# Patient Record
Sex: Male | Born: 1995 | Race: Black or African American | Hispanic: No | Marital: Single | Smoking: Never smoker
Health system: Southern US, Community
[De-identification: ages and names within clinical notes are randomized; demographics above are authoritative.]

---

## 2020-06-10 ENCOUNTER — Emergency Department (HOSPITAL_BASED_OUTPATIENT_CLINIC_OR_DEPARTMENT_OTHER): Payer: BC Managed Care – PPO

## 2020-06-10 ENCOUNTER — Other Ambulatory Visit: Payer: Self-pay

## 2020-06-10 ENCOUNTER — Encounter (HOSPITAL_BASED_OUTPATIENT_CLINIC_OR_DEPARTMENT_OTHER): Payer: Self-pay | Admitting: Emergency Medicine

## 2020-06-10 ENCOUNTER — Emergency Department (HOSPITAL_BASED_OUTPATIENT_CLINIC_OR_DEPARTMENT_OTHER)
Admission: EM | Admit: 2020-06-10 | Discharge: 2020-06-10 | Disposition: A | Discharge status: Home / Self Care | Payer: BC Managed Care – PPO | Attending: Emergency Medicine | Admitting: Emergency Medicine

## 2020-06-10 DIAGNOSIS — X501XXA Overexertion from prolonged static or awkward postures, initial encounter: Secondary | ICD-10-CM | POA: Insufficient documentation

## 2020-06-10 DIAGNOSIS — M25562 Pain in left knee: Secondary | ICD-10-CM | POA: Insufficient documentation

## 2020-06-10 DIAGNOSIS — Y990 Civilian activity done for income or pay: Secondary | ICD-10-CM | POA: Insufficient documentation

## 2020-06-10 MED ORDER — IBUPROFEN 400 MG PO TABS
600.0000 mg | ORAL_TABLET | Freq: Once | ORAL | Status: AC
  Administered 2020-06-10: 600 mg via ORAL
  Filled 2020-06-10: qty 1

## 2020-06-10 NOTE — ED Triage Notes (Signed)
Reports he twisted his left knee at work.  Painful but able to walk in triage.

## 2020-06-10 NOTE — Discharge Instructions (Signed)
Please follow up with both [facility] and Orthopedist Dr. Shon Baton for further evaluation of your knee pain.   You will need to call to schedule an appointment with both of them for further eval. Employee Health can do you worker's comp paperwork.   Please use knee immobilizer and crutches until you can see orthopedics for your knee pain. While at home please rest, ice, and elevate your knee to help reduce pain/swelling. You can take Ibuprofen and Tylenol as needed for your pain.   Return to the ED for any new/worsening symptoms

## 2020-06-10 NOTE — ED Provider Notes (Signed)
[facility] EMERGENCY DEPARTMENT Provider Note   CSN: [id number] Arrival date & time: 06/10/20  1457     History Chief Complaint  Patient presents with  . Knee Pain    Dennis Martinez is a 25 y.o. male who presents to the ED today from work with complaint of sudden onset, constant, throbbing, L knee pain s/p injury that occurred earlier today. Pt was stepping off a pallet when he twisted his left knee in the process and felt immediate pain/a popping sensation which caused him to buckle at the knee. He states he was able to catch himself before he fell completley on the ground. No head injury or LOC. He has been unable to bare weight onto the knee since then. Has not taken anything for pain. He was advised to come to this ED for further eval/worker's comp. Pt denies any other complaints at this time. No previous knee injury.   The history is provided by the patient and medical records.       No past medical history on file.  There are no problems to display for this patient.   History reviewed. No pertinent surgical history.     No family history on file.  Social History   Tobacco Use  . Smoking status: Never Smoker  . Smokeless tobacco: Never Used  Substance Use Topics  . Alcohol use: Never  . Drug use: Never    Home Medications Prior to Admission medications   Not on File    Allergies    Patient has no allergy information on record.  Review of Systems   Review of Systems  Constitutional: Negative for chills and fever.  Musculoskeletal: Positive for arthralgias and joint swelling.  Skin: Negative for color change and wound.  Neurological: Negative for weakness and numbness.  All other systems reviewed and are negative.   Physical Exam Updated Vital Signs BP 114/83 (BP Location: Right Arm)   Pulse 71   Temp 98.3 F (36.8 C) (Oral)   Resp 18   Ht 6\' 2"  (1.88 m)   Wt 90.3 kg   SpO2 100%   BMI 25.55 kg/m   Physical Exam Vitals and nursing  note reviewed.  Constitutional:      Appearance: He is not ill-appearing.  HENT:     Head: Normocephalic and atraumatic.  Eyes:     Conjunctiva/sclera: Conjunctivae normal.  Cardiovascular:     Rate and Rhythm: Normal rate and regular rhythm.  Pulmonary:     Effort: Pulmonary effort is normal.     Breath sounds: Normal breath sounds.  Musculoskeletal:     Comments: Moderate swelling to left knee compared to right with TTP along the inferior aspect. ROM limited s/2 pain however able to flex and extend. Negative anterior and posterior drawer test. No obvious varus or valgus laxity however pt unable to fully tolerate test due to pain. Strength and sensation intact. 2+ DP pulse.   Skin:    General: Skin is warm and dry.     Coloration: Skin is not jaundiced.  Neurological:     Mental Status: He is alert.     ED Results / Procedures / Treatments   Labs (all labs ordered are listed, but only abnormal results are displayed) Labs Reviewed - No data to display  EKG None  Radiology DG Knee Complete 4 Views Left  Result Date: 06/10/2020 CLINICAL DATA:  Pain EXAM: LEFT KNEE - COMPLETE 4+ VIEW COMPARISON:  None. FINDINGS: No acute fracture or dislocation.  Joint spaces and alignment are maintained. No area of erosion or osseous destruction. No unexpected radiopaque foreign body. Soft tissues are unremarkable. IMPRESSION: No acute fracture or dislocation. Electronically Signed   By: Meda Klinefelter MD   On: 06/10/2020 15:51    Procedures Procedures   Medications Ordered in ED Medications  ibuprofen (ADVIL) tablet 600 mg (600 mg Oral Given 06/10/20 1530)    ED Course  I have reviewed the triage vital signs and the nursing notes.  Pertinent labs & imaging results that were available during my care of the patient were reviewed by me and considered in my medical decision making (see chart for details).    MDM Rules/Calculators/A&P                          25 year old male presents  to the ED today from work after sustaining a left knee injury where he twisted his ankle and felt a popping sensation.  Unable to bear weight since then.  Sent for [employer].  On arrival to the ED vitals are stable.  Patient is afebrile, nontachycardic and nontachypneic and appears to be in no acute distress.  He has obvious mild swelling noted to the left knee compared to right with tenderness palpation along the inferior aspect.  Range of motion limited however able to flex and extend.  Strength and sensation intact.  No obvious ligamentous injury at this time.  We will plan for x-ray today.  Patient will likely need knee immobilizer and crutches and Ortho follow-up.  Unfortunately we do not do [employer]. here today however will provide information for [facility] for further evaluation.  Patient was sent with paperwork to fill out for physical capacities, will send him back home with this and have the provider at health and wellness fill this out. Ibuprofen for pain.   Xray negative for bony abnormality at this time. Will provide knee immobilizer and crutches. Pt given info for [employer] for worker's comp. Will have him follow up with orthopedist for same. Pt instructed on RICE therapy at home and Ibuprofen/Tylenol PRN for pain. He is in agreement with plan and stable for discharge.   This note was prepared using Dragon voice recognition software and may include unintentional dictation errors due to the inherent limitations of voice recognition software.  Final Clinical Impression(s) / ED Diagnoses Final diagnoses:  Acute pain of left knee    Rx / DC Orders ED Discharge Orders    None       Discharge Instructions     Please follow up with both [facility] and Orthopedist Dr. Shon Baton for further evaluation of your knee pain.   You will need to call to schedule an appointment with both of them for further eval. Employee Health can do you  worker's comp paperwork.   Please use knee immobilizer and crutches until you can see orthopedics for your knee pain. While at home please rest, ice, and elevate your knee to help reduce pain/swelling. You can take Ibuprofen and Tylenol as needed for your pain.   Return to the ED for any new/worsening symptoms       Tanda Rockers, Cordelia Poche 06/10/20 1612    Cathren Laine, MD 06/14/20 1402

## 2020-06-10 NOTE — ED Notes (Signed)
ED Provider at bedside. 

## 2021-12-10 IMAGING — CR DG KNEE COMPLETE 4+V*L*
4 series · 4 of 4 positions shown · non-contrast
Comparison: None.

CLINICAL DATA: Pain

EXAM:
LEFT KNEE - COMPLETE 4+ VIEW

[t knee lat left]
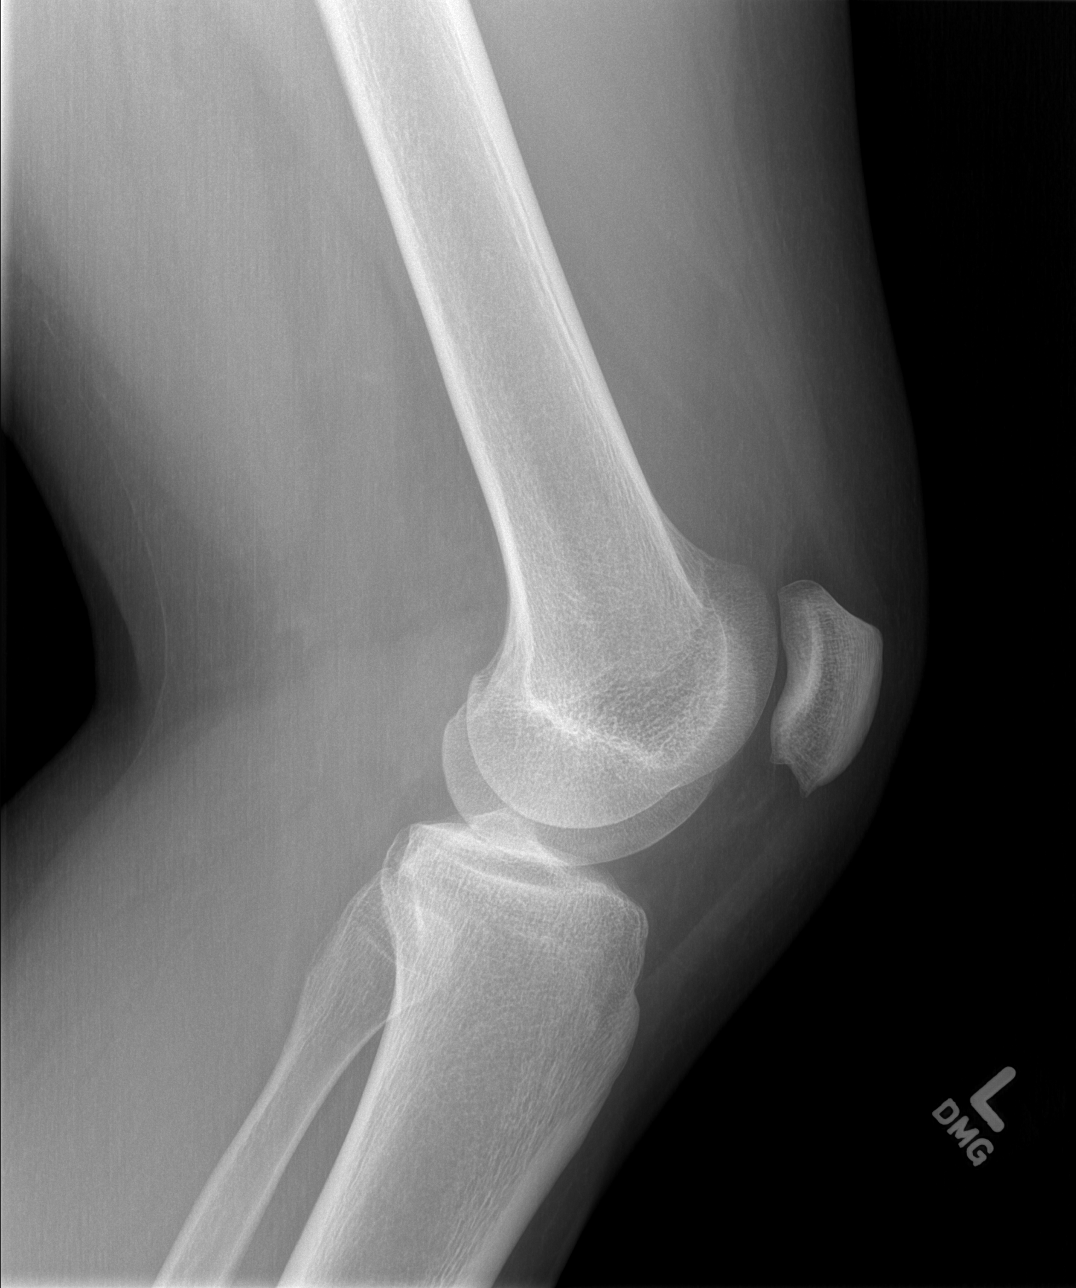

[t knee ap left]
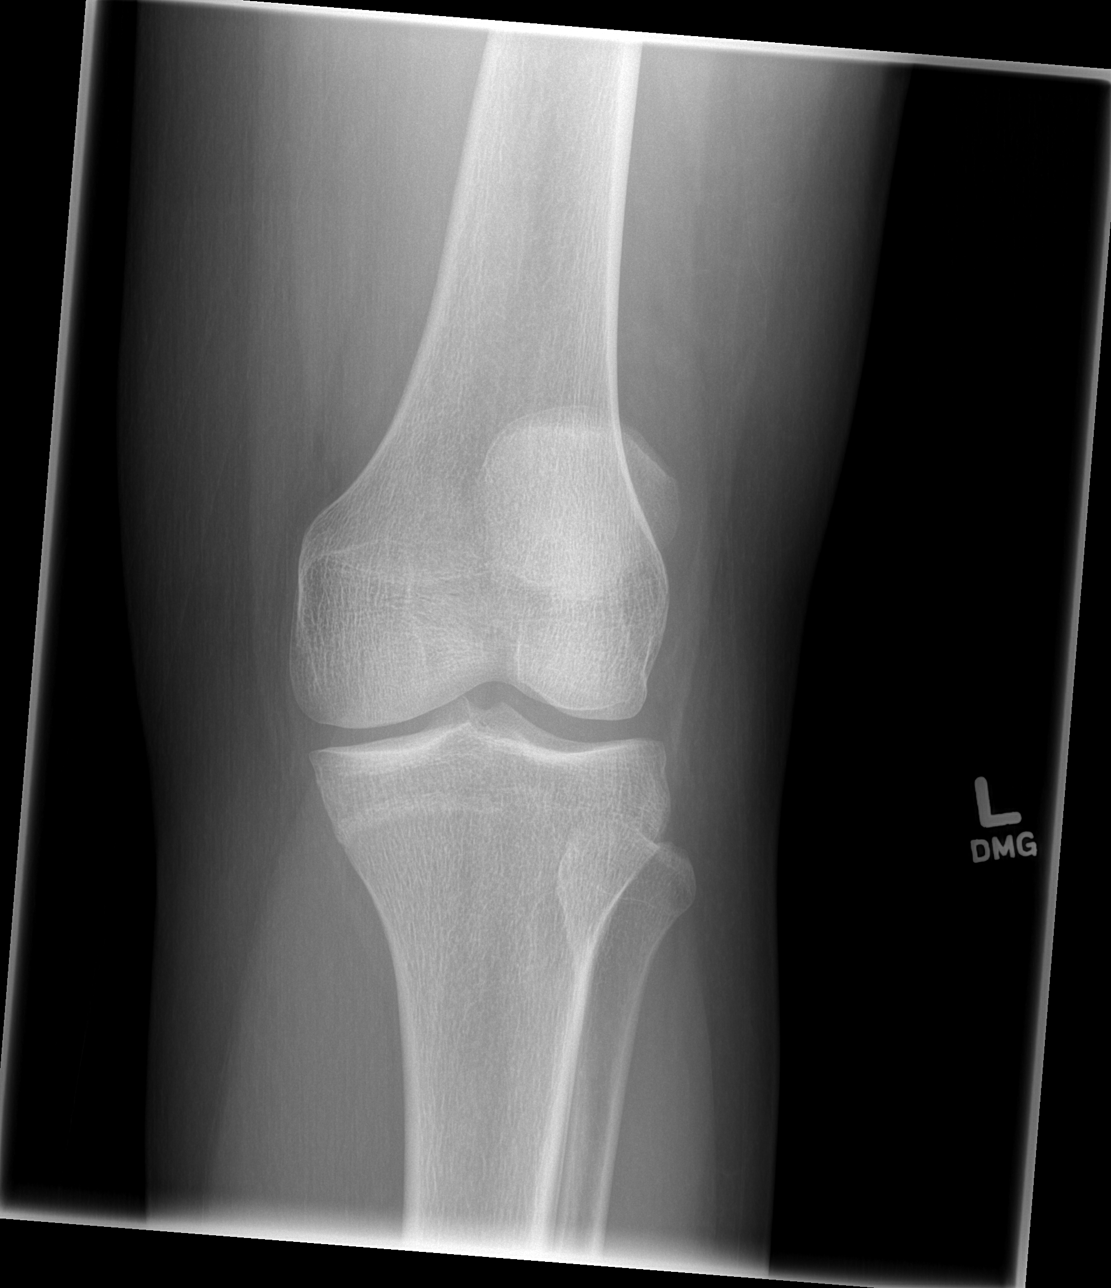

[t knee oblique left (1 of 2)]
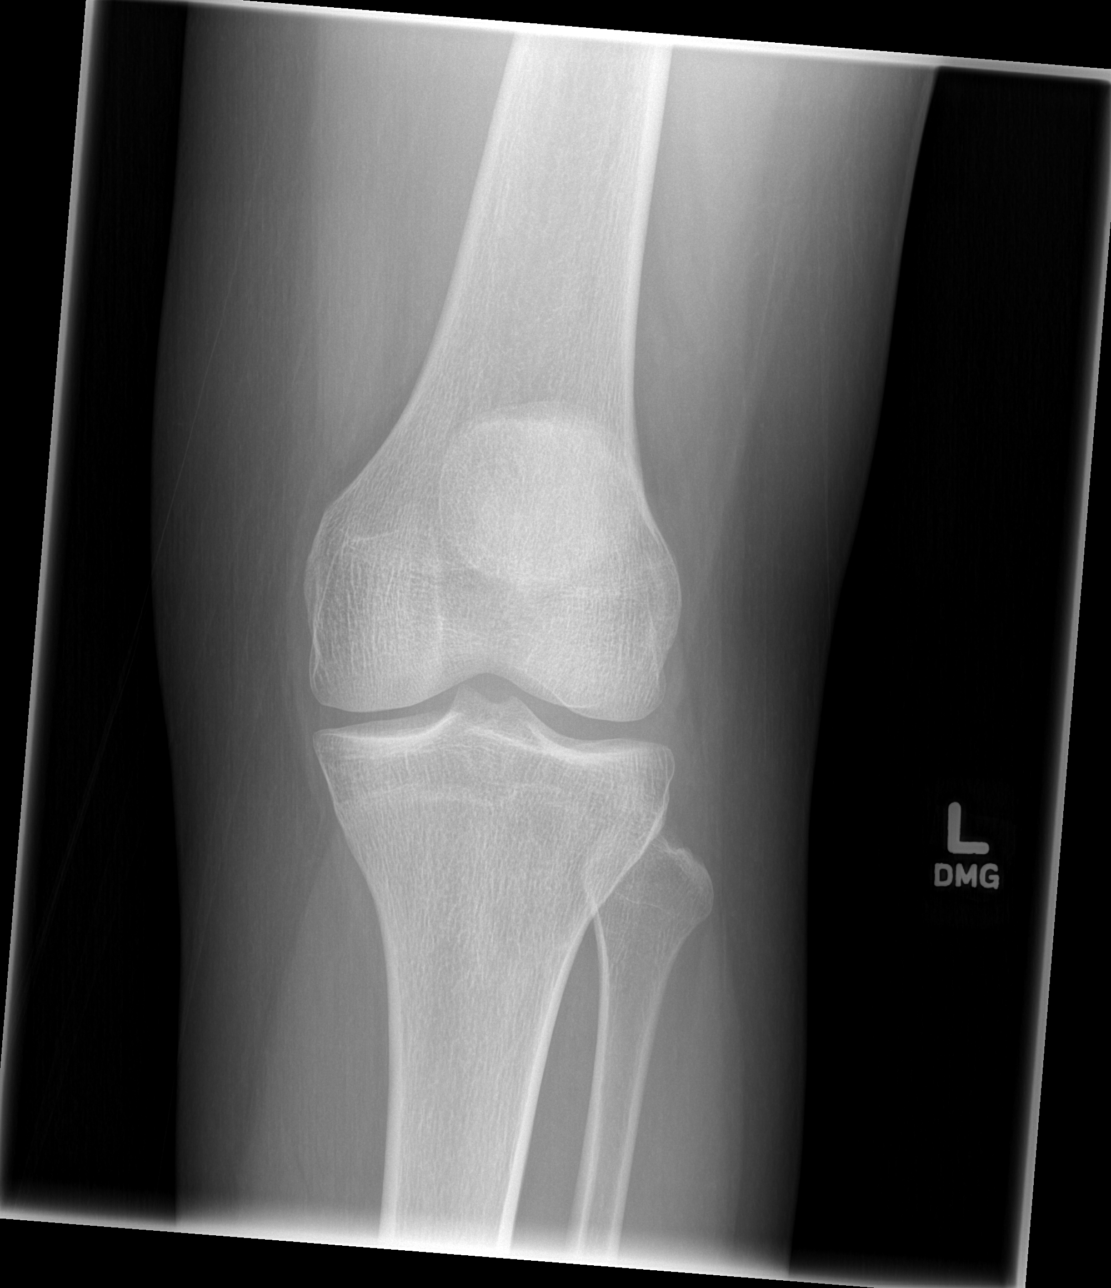

[t knee oblique left (2 of 2)]
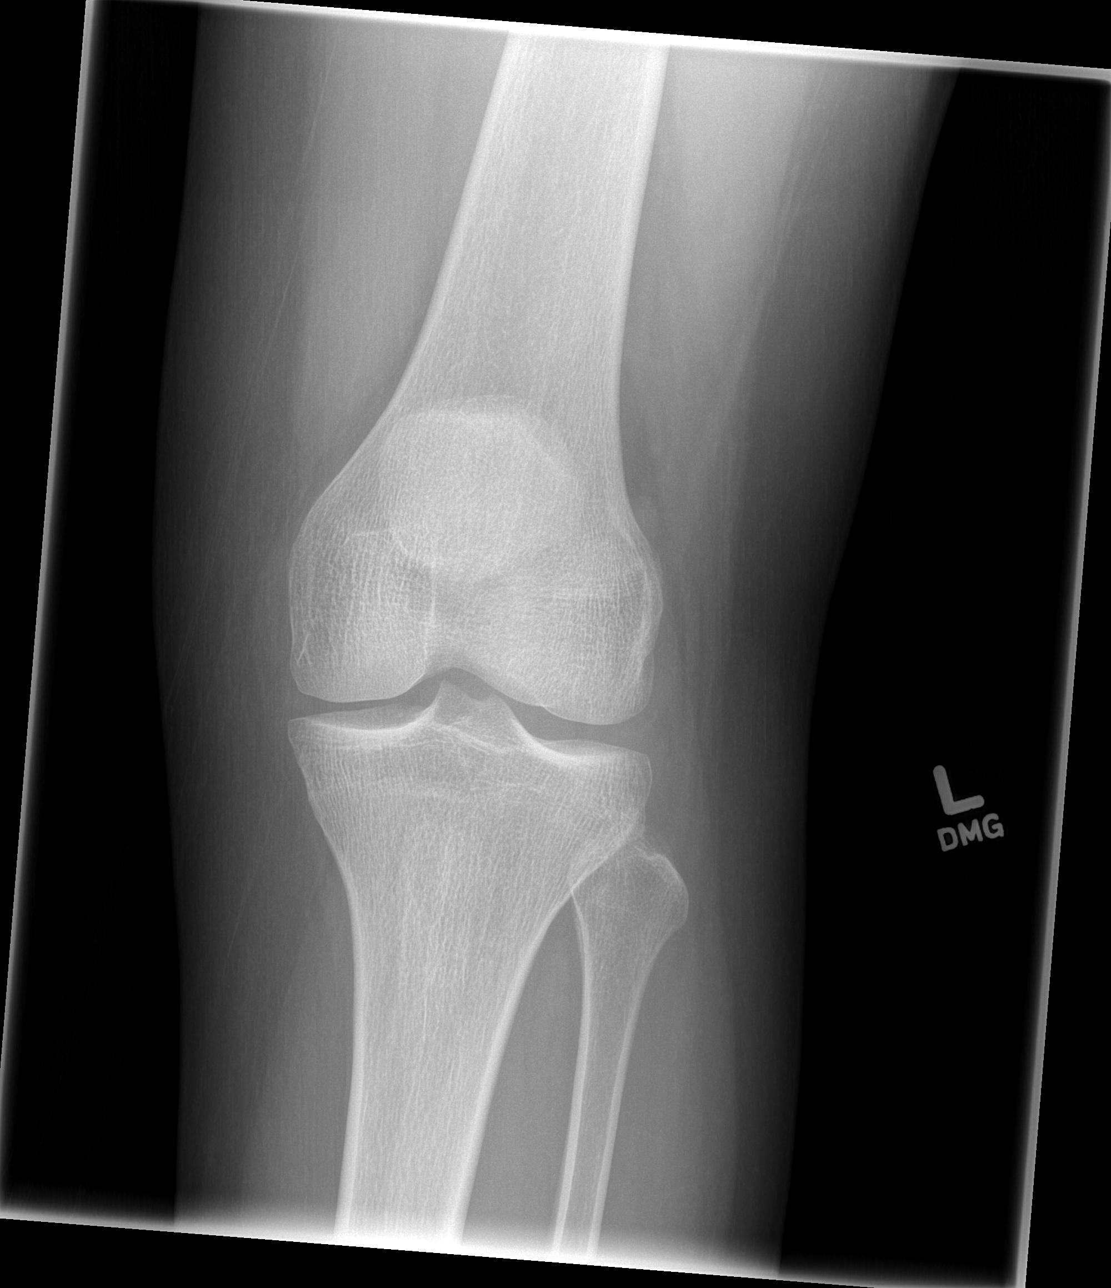

[4 of 4 positions shown; findings below may reference images not displayed]

FINDINGS: No acute fracture or dislocation. Joint spaces and alignment are
maintained. No area of erosion or osseous destruction. No unexpected
radiopaque foreign body. Soft tissues are unremarkable.
IMPRESSION: No acute fracture or dislocation.
